# Patient Record
Sex: Male | Born: 1985 | Race: White | Hispanic: No | Marital: Married | Smoking: Current every day smoker
Health system: Southern US, Community
[De-identification: ages and names within clinical notes are randomized; demographics above are authoritative.]

## PROBLEM LIST (undated history)

## (undated) HISTORY — PX: TYMPANOSTOMY TUBE PLACEMENT: SHX32

---

## 2014-03-24 ENCOUNTER — Inpatient Hospital Stay: Payer: Self-pay | Admitting: Student

## 2014-03-24 LAB — CBC WITH DIFFERENTIAL/PLATELET
BASOS ABS: 0 10*3/uL (ref 0.0–0.1)
Basophil %: 0.5 %
Eosinophil #: 0.1 10*3/uL (ref 0.0–0.7)
Eosinophil %: 1.5 %
HCT: 42.8 % (ref 40.0–52.0)
HGB: 14.2 g/dL (ref 13.0–18.0)
Lymphocyte #: 1.4 10*3/uL (ref 1.0–3.6)
Lymphocyte %: 15.2 %
MCH: 30.9 pg (ref 26.0–34.0)
MCHC: 33 g/dL (ref 32.0–36.0)
MCV: 93 fL (ref 80–100)
Monocyte #: 0.8 x10 3/mm (ref 0.2–1.0)
Monocyte %: 8.3 %
NEUTROS ABS: 7 10*3/uL — AB (ref 1.4–6.5)
Neutrophil %: 74.5 %
Platelet: 210 10*3/uL (ref 150–440)
RBC: 4.59 10*6/uL (ref 4.40–5.90)
RDW: 12.3 % (ref 11.5–14.5)
WBC: 9.4 10*3/uL (ref 3.8–10.6)

## 2014-03-24 LAB — BASIC METABOLIC PANEL
Anion Gap: 6 — ABNORMAL LOW (ref 7–16)
BUN: 22 mg/dL — ABNORMAL HIGH (ref 7–18)
CALCIUM: 9.2 mg/dL (ref 8.5–10.1)
CREATININE: 1.09 mg/dL (ref 0.60–1.30)
Chloride: 104 mmol/L (ref 98–107)
Co2: 27 mmol/L (ref 21–32)
EGFR (African American): >60
EGFR (Non-African Amer.): >60
Glucose: 85 mg/dL (ref 65–99)
Osmolality: 276 (ref 275–301)
Potassium: 3.9 mmol/L (ref 3.5–5.1)
Sodium: 137 mmol/L (ref 136–145)

## 2014-03-25 LAB — BASIC METABOLIC PANEL
Anion Gap: 5 — ABNORMAL LOW (ref 7–16)
BUN: 16 mg/dL (ref 7–18)
CALCIUM: 9 mg/dL (ref 8.5–10.1)
CHLORIDE: 106 mmol/L (ref 98–107)
CREATININE: 1.12 mg/dL (ref 0.60–1.30)
Co2: 27 mmol/L (ref 21–32)
GLUCOSE: 77 mg/dL (ref 65–99)
Osmolality: 276 (ref 275–301)
Potassium: 3.9 mmol/L (ref 3.5–5.1)
SODIUM: 138 mmol/L (ref 136–145)

## 2014-03-25 LAB — CBC WITH DIFFERENTIAL/PLATELET
Basophil #: 0 10*3/uL (ref 0.0–0.1)
Basophil %: 0.4 %
EOS ABS: 0.3 10*3/uL (ref 0.0–0.7)
EOS PCT: 4.2 %
HCT: 42.1 % (ref 40.0–52.0)
HGB: 14 g/dL (ref 13.0–18.0)
LYMPHS ABS: 1.4 10*3/uL (ref 1.0–3.6)
LYMPHS PCT: 22.4 %
MCH: 31.1 pg (ref 26.0–34.0)
MCHC: 33.2 g/dL (ref 32.0–36.0)
MCV: 94 fL (ref 80–100)
MONOS PCT: 10.7 %
Monocyte #: 0.7 x10 3/mm (ref 0.2–1.0)
NEUTROS PCT: 62.3 %
Neutrophil #: 4 10*3/uL (ref 1.4–6.5)
Platelet: 176 10*3/uL (ref 150–440)
RBC: 4.49 10*6/uL (ref 4.40–5.90)
RDW: 12.5 % (ref 11.5–14.5)
WBC: 6.4 10*3/uL (ref 3.8–10.6)

## 2014-03-25 LAB — LIPID PANEL
Cholesterol: 140 mg/dL (ref 0–200)
HDL Cholesterol: 52 mg/dL (ref 40–60)
Ldl Cholesterol, Calc: 79 mg/dL (ref 0–100)
TRIGLYCERIDES: 46 mg/dL (ref 0–200)
VLDL Cholesterol, Calc: 9 mg/dL (ref 5–40)

## 2014-03-25 LAB — HEMOGLOBIN A1C: Hemoglobin A1C: 5.5 % (ref 4.2–6.3)

## 2014-11-07 ENCOUNTER — Ambulatory Visit: Payer: Self-pay | Admitting: Physician Assistant

## 2015-02-11 NOTE — H&P (Signed)
PATIENT NAME:  Arthur Burnett, DOOM MR#:  [id number] DATE OF BIRTH:  1985/11/20  DATE OF ADMISSION:  03/24/2014  PRIMARY CARE PHYSICIAN: Dr. Greggory Stallion at [facility].  REFERRING EMERGENCY ROOM PHYSICIAN: Dr. Dorothea Glassman  CHIEF COMPLAINT: Swelling and redness on the arm.  HISTORY OF PRESENTING ILLNESS: A 29 year old male with no past medical history and not following with doctor regularly, not taking any medication at home. Had some redness and swelling on his left forearm, which he noticed since the last 4 days, which is gradually getting worse, so finally he went to his medical office yesterday, and  prescribed oral Bactrim and told to come back within 24 hours. They also marked the area with marker on the skin for redness. Today when he went back, his redness was worse than yesterday, so the doctor advised him to come to the Emergency Room. On further questioning, he said there is some pain, but which is mild, 6 out of 10, constant, some heaviness type, but it is not restricting his movements. He is able to do all the activities, and denies any fever.   REVIEW OF SYSTEMS:   CONSTITUTIONAL: Negative for fever, fatigue, weakness. Positive for mild pain on left forearm. No weight loss.  EYES: No blurring, double vision, discharge or redness.  EARS, NOSE, THROAT: No tinnitus, ear pain, hearing loss.  RESPIRATORY: No cough, wheezing, hemoptysis, or shortness of breath.  CARDIOVASCULAR: No chest pain, orthopnea, edema, arrhythmia, palpitations.  GASTROINTESTINAL: No nausea, vomiting, diarrhea, abdominal pain.  GENITOURINARY: No dysuria, hematuria, increased frequency.  ENDOCRINE: No heat or cold intolerance. No excessive sweating.  SKIN: No acne, rashes, or lesions.  MUSCULOSKELETAL: No pain or swelling in the joints, but there is swelling of the left forearm.  NEUROLOGIC: No numbness, weakness, tremor or vertigo.  PSYCHIATRIC: No anxiety, insomnia, bipolar disorder.    PAST MEDICAL HISTORY: None, but not following with any doctor.   PAST SURGICAL HISTORY: None.   SOCIAL HISTORY: No smoking, no drinking alcohol, no illegal drug use. He works in a [employer]  FAMILY HISTORY: Positive for diabetes in his mother and grandfather. His sister was diagnosed with diabetes at age of 105. Father has hypertension.   HOME MEDICATIONS: Bactrim, which was started yesterday.   VITAL SIGNS: Temperature 98.2, pulse 71, respirations 18, blood pressure 139/90, pulse ox is 98% on room air.   PHYSICAL EXAMINATION: GENERAL: The patient is fully alert and oriented to time, place, and person. Does not appear in any acute distress.  HEENT: Head and neck atraumatic. Conjunctiva pink. Oral mucosa moist.  NECK: Supple. No JVD.  RESPIRATORY: Bilateral equal and clear air entry.  CARDIOVASCULAR: S1, S2 present. Regular. No murmur.  ABDOMEN: Soft, nontender. Bowel sounds present. No organomegaly.  SKIN: On left forearm extending from wrist to elbow, there is redness and slightly warm to touch, and tender. There is a furuncle in the center of that, appears to be the origin point of the cellulitis. LEGS: No edema.  NEUROLOGIC: Power 5/5. Follows commands. No tremor or rigidity. Sensation is preserved. Left arm movements are normal.  PSYCHIATRIC: Does not appear in any acute psychiatric illness at this time.   IMPORTANT LAB RESULTS: Glucose 85, BUN 22, creatinine 1.09, sodium 137, potassium 3.9, chloride 104, CO2 is 27, calcium 9.2. WBC 9.4, hemoglobin 14.2, platelet count is 210, MCV is 93.   ASSESSMENT AND PLAN: A 29 year old male who has cellulitis, was given Bactrim as outpatient, but had worsening in the redness  and swelling, so came to Emergency Room.   1.  Cellulitis. Failed outpatient treatment. Will give IV vancomycin and Zosyn over here. Will  check his HbA1c level, as he has strong family history of diabetes,   Total time spent on this admission is 40 minutes.     ____________________________ Hope PigeonVaibhavkumar G. Elisabeth PigeonVachhani, MD vgv:mr D: 03/24/2014 20:27:05 ET T: 03/24/2014 20:49:34 ET JOB#: [id number]  cc: Hope PigeonVaibhavkumar G. Elisabeth PigeonVachhani, MD, <Dictator> Altamese DillingVAIBHAVKUMAR Dawna Jakes MD ELECTRONICALLY SIGNED 04/06/2014 9:05

## 2015-02-11 NOTE — Discharge Summary (Signed)
PATIENT NAME:  Arthur Burnett, [name] MR#:  [id number] DATE OF BIRTH:  Aug 10, 1986  DATE OF ADMISSION:  03/24/2014 DATE OF DISCHARGE:  03/26/2014  CONSULTANTS: Cristal Deerhristopher A. Lundquist, MD, from surgery.   CHIEF COMPLAINT: Left arm swelling and redness.   DISCHARGE DIAGNOSES: 1. Cellulitis/superficial abscess of the left upper extremity.  2. 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   MEDICATIONS:  1. Bactrim double strength 1 tab 2 times a day for 9 days.  2. Ultram 50 mg every 6 to 8 hours as needed for pain for 2 days only 3. Dry dressing.   FOLLOWUP: Please follow with PCP within 1 to 2 weeks. Please follow with surgery next week. If redness or drainage worsens, you develop fevers or any increase in swelling of your infection site, call your doctor right away.   DISPOSITION: Home.   SIGNIFICANT LABS AND IMAGING: Initial white count of 9.4, BUN 22, creatinine 1.09, last white count of 6.4.   HISTORY OF PRESENT ILLNESS AND HOSPITAL COURSE: For full details of H and P, please see the dictation on June 4th by Dr. Elisabeth PigeonVachhani. This is a pleasant 29 year old who is usually in good health, who came in for the above chief complaint. He had noticed that it started the last four days, which gradually got worse and he went to see [facility] Gre[facility] at [facility] in [place] who prescribed him Bactrim. The patient took had 2 tablets of Bactrim and came into the hospital as he felt the area was getting worse. He was admitted to the hospitalist service and started on vancomycin and Zosyn. He did have evidence for cellulitis and the area did develop a little bit of drainage. He has good range of motion. He was seen by surgery today, who felt that the area was draining adequately at this time and cellulitis is more superficial in nature and does not need and I and D. The patient is afebrile, has no significant leukocytosis, and  the redness has improved. He will be discharged with instructions to finish the  Bactrim he already has at home, which is 9 tablets, and a prescription for a few doses of Ultram was given to him. He denies having any recent IV drug abuse. This is posterior forearm and he was instructed to keep dry dressings on it.   TOTAL TIME SPENT: About 35 minutes today.   CODE STATUS: The patient is a full code.    ____________________________ Krystal EatonShayiq Adaiah Morken, MD sa:lm D: 03/26/2014 12:27:06 ET T: 03/26/2014 22:00:33 ET JOB#: [id number]  cc: Krystal EatonShayiq Komal Stangelo, MD, <Dictator> Angus PalmsSionne George, MD Si Raiderhristopher A. Juliann PulseLundquist, MD Krystal EatonSHAYIQ Lamona Eimer MD ELECTRONICALLY SIGNED 04/19/2014 12:42

## 2015-02-11 NOTE — Consult Note (Signed)
PATIENT NAME:  Arthur Burnett, Arthur Burnett MR#:  [id number] DATE OF BIRTH:  Sep 21, 1986  DATE OF CONSULTATION:  03/26/2014  CONSULTING PHYSICIAN:  Cristal Deerhristopher A. Deniece Rankin, MD  REASON FOR CONSULT: Swelling, redness, and purulence from left posterior arm.  HISTORY OF PRESENT ILLNESS:  Arthur Burnett is a pleasant, 29 year old male who has no significant past medical history except for recurrent small abscesses throughout his body, who presented with 4 days of worsening redness and swelling of the left forearm. Arthur Burnett went to his medical office, who prescribed him Bactrim. Arthur Burnett returned to the doctor who prescribed him the Bactrim, and it was noted to be more red and erythematous. At that time Arthur Burnett was brought to the Emergency Room. It was painful at that time. No change in range of motion. No fevers, chills, night sweats, shortness of breath, chest pain, abdominal pain, nausea, vomiting, diarrhea, constipation, dysuria, hematuria.   PAST MEDICAL HISTORY: None.   PAST SURGICAL HISTORY: None.   HOME MEDICATIONS: Bactrim which was started one day prior to admission.  SOCIAL HISTORY: Denies alcohol or tobacco use. Does not have a history of IV drug use.   FAMILY HISTORY: Positive for diabetes and hypertension.   REVIEW OF SYSTEMS:  A 12-point review of systems was obtained. Pertinent positives and negatives as above.   PHYSICAL EXAMINATION:  VITAL SIGNS: Temperature 98.1, pulse 50, blood pressure 98/55, respirations 69, 100% on room air.  GENERAL: No acute distress. Alert and oriented x 3.  HEAD: Normocephalic, atraumatic.  EYES: No scleral icterus. No conjunctivitis.  FACE:  No obvious facial trauma. Normal external nose. Normal external ears.  CHEST: Lungs clear to auscultation. Moving air well.  HEART: Regular rate and rhythm. No murmurs, rubs or gallops.  ABDOMEN: Soft, nontender, nondistended.  EXTREMITIES: Arthur Burnett does have a small area of some superficial blistering with some purulence. It does not  extend into the deep subcutaneous tissues. No obvious induration. No obvious erythema. Strength 5 out of 5.  NEUROLOGIC: Cranial nerves II through XII grossly intact.   LABS:  His recent white cell count was 6.4, down from 9.4 on admission, hemoglobin, hematocrit, and platelets are all unremarkable and stable. Creatinine is 1.12.  IMAGING:  No imaging was performed.   ASSESSMENT AND PLAN: Arthur Burnett is a pleasant, 29 year old with a history of left arm abscess, which is draining spontaneously. No need for surgical intervention. Would continue Bactrim. May follow up with me as an outpatient to ensure resolution. 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     ____________________________ Si Raiderhristopher A. Wilburta Milbourn, MD cal:mr D: 03/26/2014 12:25:59 ET T: 03/26/2014 18:04:48 ET JOB#: [id number]  cc: Cristal Deerhristopher A. Henryk Ursin, MD, <Dictator> Jarvis NewcomerHRISTOPHER A Braelynne Garinger MD ELECTRONICALLY SIGNED 04/12/2014 10:47

## 2016-01-08 ENCOUNTER — Other Ambulatory Visit: Payer: Self-pay | Admitting: Nurse Practitioner

## 2016-01-08 DIAGNOSIS — K739 Chronic hepatitis, unspecified: Secondary | ICD-10-CM

## 2016-01-16 ENCOUNTER — Ambulatory Visit: Payer: Self-pay

## 2016-01-16 ENCOUNTER — Ambulatory Visit
Admission: RE | Admit: 2016-01-16 | Discharge: 2016-01-16 | Disposition: A | Discharge status: Home / Self Care | Payer: BLUE CROSS/BLUE SHIELD | Source: Ambulatory Visit | Attending: Nurse Practitioner | Admitting: Nurse Practitioner

## 2016-01-16 DIAGNOSIS — R932 Abnormal findings on diagnostic imaging of liver and biliary tract: Secondary | ICD-10-CM | POA: Diagnosis not present

## 2016-01-16 DIAGNOSIS — K739 Chronic hepatitis, unspecified: Secondary | ICD-10-CM

## 2017-06-15 IMAGING — US US ABDOMEN LIMITED
1 series · 12 of 12 positions shown · non-contrast
Comparison: None.

CLINICAL DATA: Chronic hepatitis-C.

EXAM:
US ABDOMEN LIMITED - RIGHT UPPER QUADRANT
ULTRASOUND HEPATIC ELASTOGRAPHY
TECHNIQUE: Limited right upper quadrant abdominal ultrasound was performed. In
addition, ultrasound elastography evaluation of the liver was
performed. A region of interest was placed in the right lobe of the
liver. Following application of a compressive sonographic pulse,
shear waves were detected in the adjacent hepatic tissue and the
shear wave velocity was calculated. Multiple assessments were
performed at the selected site. Median shear wave velocity is
correlated to a Metavir fibrosis score.

[Series 1: us abdomen limited · 0.20mm/px · 12 of 12 slices shown]
[im 1/12]
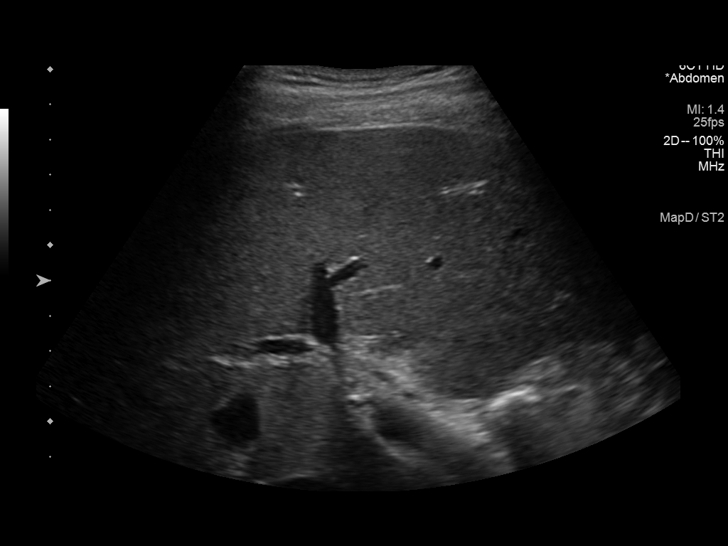
[im 2/12]
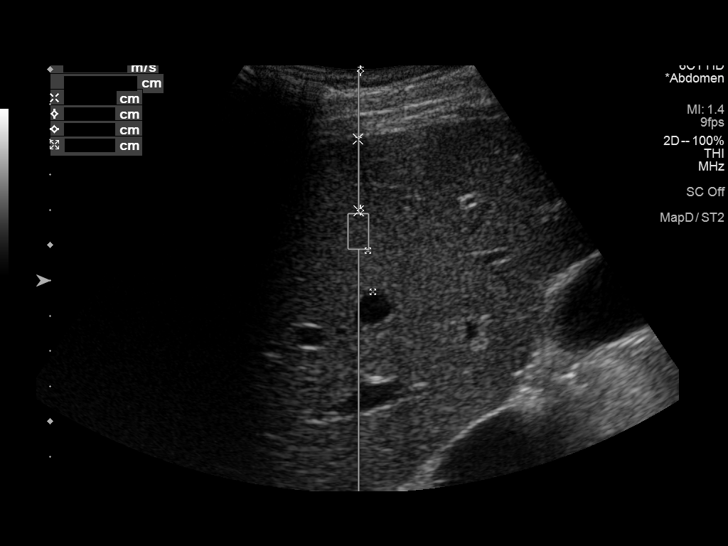
[im 3/12]
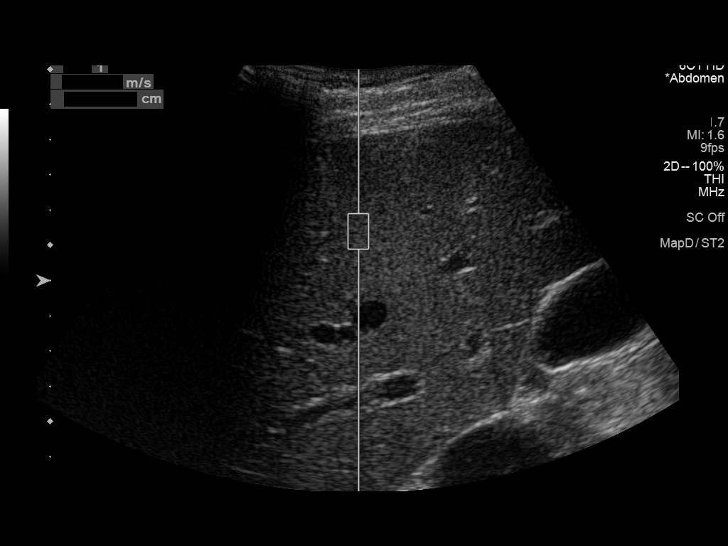
[im 4/12]
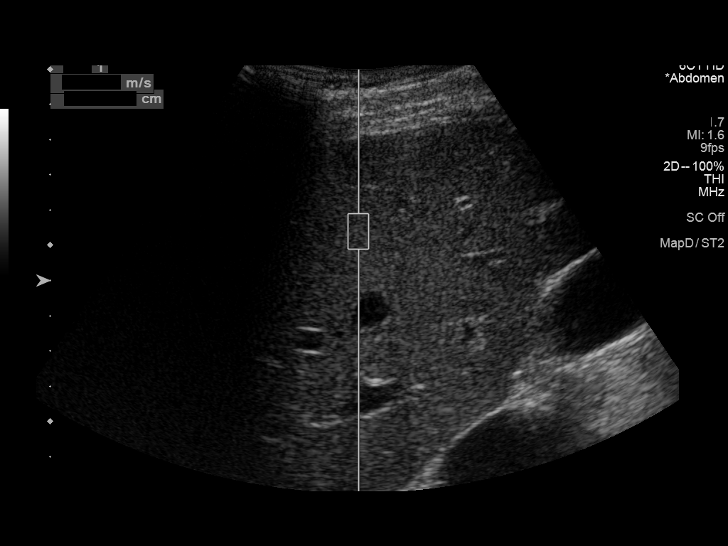
[im 5/12]
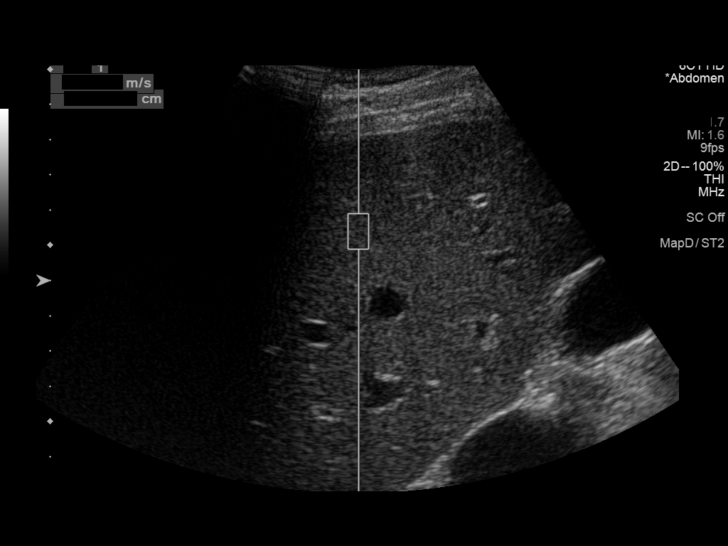
[im 6/12]
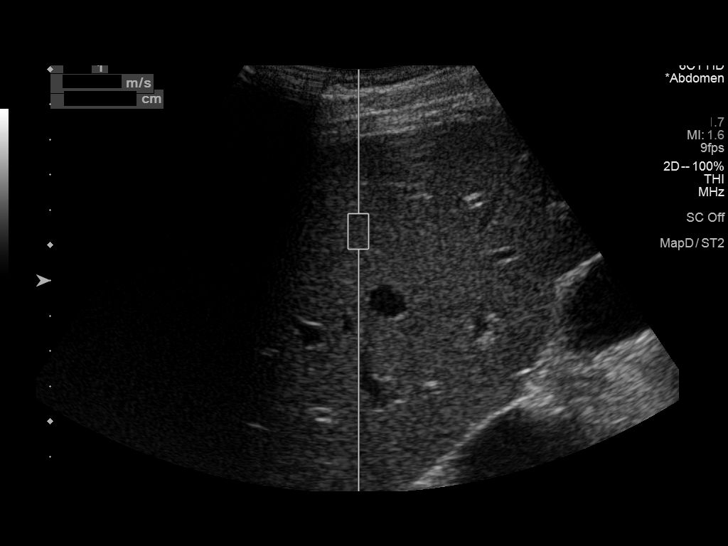
[im 7/12]
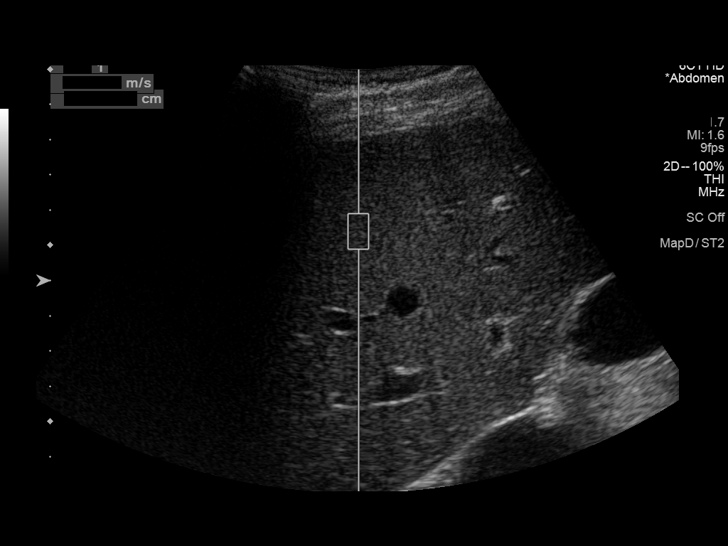
[im 8/12]
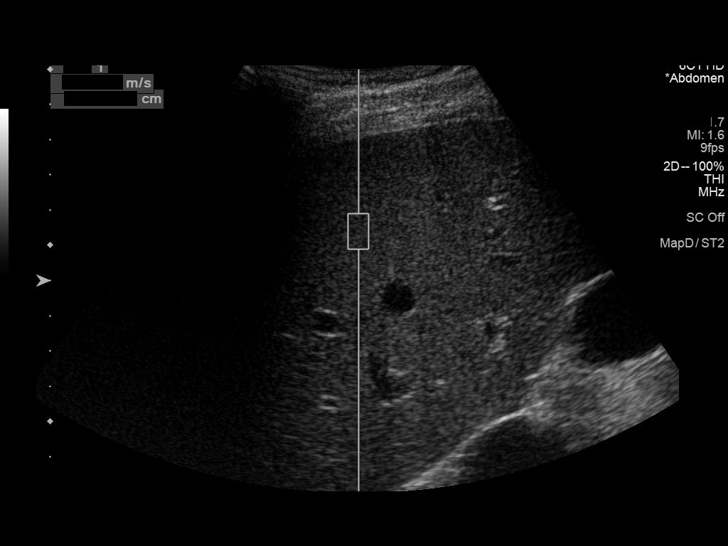
[im 9/12]
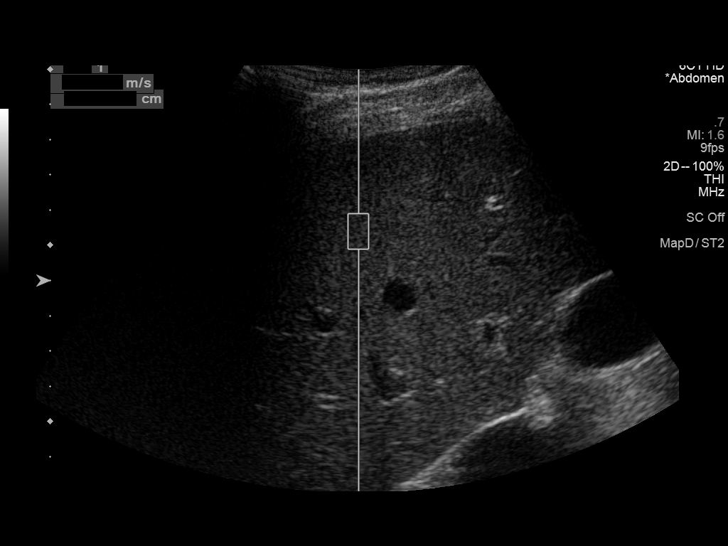
[im 10/12]
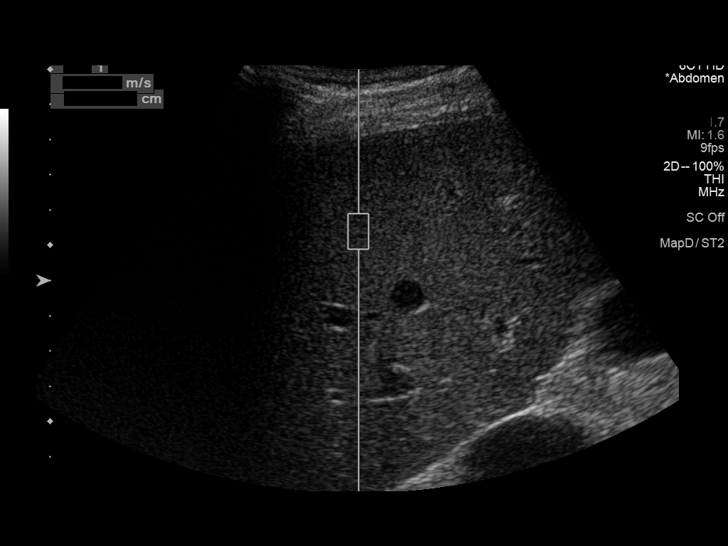
[im 11/12]
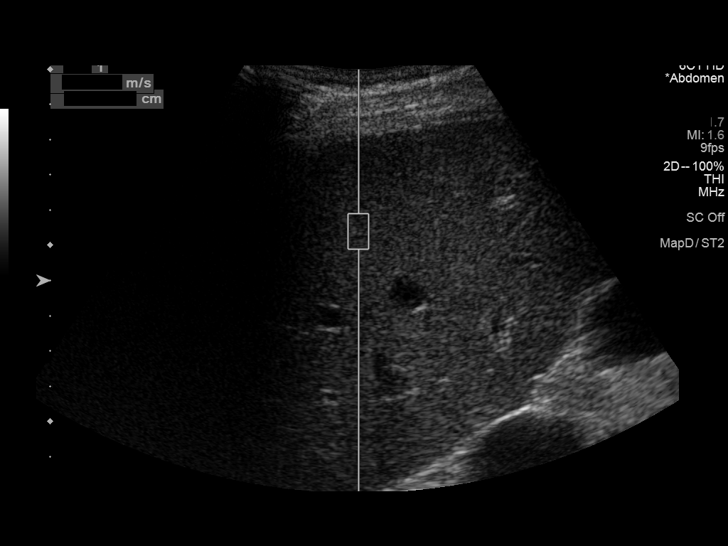
[im 12/12]
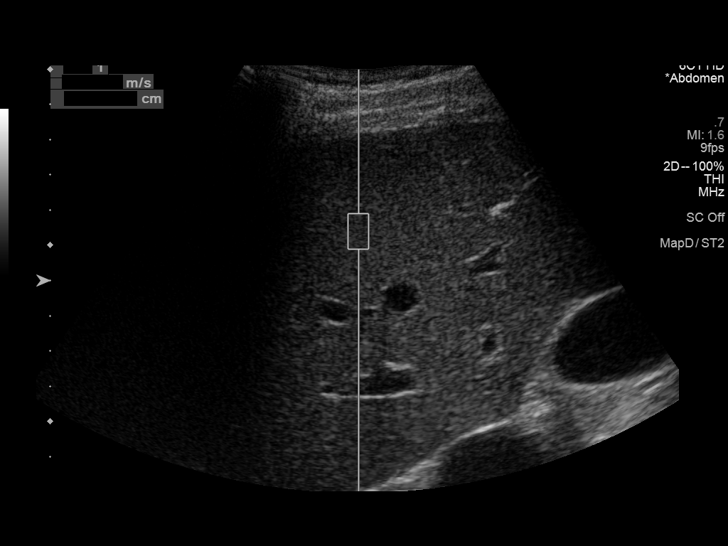

[12 of 12 positions shown; findings below may reference images not displayed]

FINDINGS: ULTRASOUND ABDOMEN LIMITED RIGHT UPPER QUADRANT

Gallbladder: No gallstones or wall thickening visualized. No
sonographic Murphy sign noted by sonographer.

Common bile duct: Diameter: 3 mm.

Liver: No focal lesion or significant contour irregularity
identified. Within normal limits in parenchymal echogenicity.

ULTRASOUND HEPATIC ELASTOGRAPHY

Device: Siemens Helix VTQ

Transducer 6C1

Patient position: Supine

Number of measurements:  10

Hepatic Segment:  8

Median velocity:   1.85  m/sec

IQR:

IQR/Median velocity ratio

Corresponding Metavir fibrosis score:  F2 + some F3

Risk of fibrosis: Moderate

Follow up:   Additional testing appropriate

Limitations of exam:  None.

Pertinent findings noted on other imaging exams:  None

Please note that abnormal shear wave velocities may also be
identified in clinical settings other than with hepatic fibrosis,
such as: acute hepatitis, elevated right heart and central venous
pressures including use of beta blockers, Makhosi disease
(Fraiji), infiltrative processes such as
mastocytosis/amyloidosis/infiltrative tumor, extrahepatic
cholestasis, in the post-prandial state, and liver transplantation.
Correlation with patient history, laboratory data, and clinical
condition recommended.
IMPRESSION: 1. Morphologically, the liver and gallbladder appear normal. No
focal hepatic abnormalities are identified.
2. Abnormal hepatic elastography study. Median hepatic shear wave
velocity is calculated at 1.85 m/sec.
Corresponding Metavir fibrosis score is F2 + some F3. Risk of
fibrosis is moderate.
3. Follow-up:  Additional testing appropriate.

## 2018-02-11 ENCOUNTER — Emergency Department: Payer: No Typology Code available for payment source

## 2018-02-11 ENCOUNTER — Other Ambulatory Visit: Payer: Self-pay

## 2018-02-11 ENCOUNTER — Inpatient Hospital Stay
Admission: EM | Admit: 2018-02-11 | Discharge: 2018-02-13 | DRG: 195 | Disposition: A | Discharge status: Home / Self Care | Payer: No Typology Code available for payment source | Attending: Specialist | Admitting: Specialist

## 2018-02-11 DIAGNOSIS — J189 Pneumonia, unspecified organism: Secondary | ICD-10-CM | POA: Diagnosis not present

## 2018-02-11 DIAGNOSIS — Z9622 Myringotomy tube(s) status: Secondary | ICD-10-CM | POA: Diagnosis present

## 2018-02-11 DIAGNOSIS — R091 Pleurisy: Secondary | ICD-10-CM | POA: Diagnosis present

## 2018-02-11 DIAGNOSIS — F17210 Nicotine dependence, cigarettes, uncomplicated: Secondary | ICD-10-CM | POA: Diagnosis present

## 2018-02-11 DIAGNOSIS — J181 Lobar pneumonia, unspecified organism: Secondary | ICD-10-CM | POA: Diagnosis not present

## 2018-02-11 DIAGNOSIS — A419 Sepsis, unspecified organism: Secondary | ICD-10-CM

## 2018-02-11 DIAGNOSIS — E860 Dehydration: Secondary | ICD-10-CM | POA: Diagnosis present

## 2018-02-11 LAB — URINALYSIS, ROUTINE W REFLEX MICROSCOPIC
BACTERIA UA: NONE SEEN
BILIRUBIN URINE: NEGATIVE
Glucose, UA: NEGATIVE mg/dL
Hgb urine dipstick: NEGATIVE
Ketones, ur: 20 mg/dL — AB
Leukocytes, UA: NEGATIVE
Nitrite: NEGATIVE
PH: 7 (ref 5.0–8.0)
Protein, ur: 30 mg/dL — AB
SPECIFIC GRAVITY, URINE: 1.027 (ref 1.005–1.030)

## 2018-02-11 LAB — BASIC METABOLIC PANEL
ANION GAP: 6 (ref 5–15)
BUN: 9 mg/dL (ref 6–20)
CO2: 24 mmol/L (ref 22–32)
CREATININE: 0.7 mg/dL (ref 0.61–1.24)
Calcium: 8.2 mg/dL — ABNORMAL LOW (ref 8.9–10.3)
Chloride: 109 mmol/L (ref 101–111)
GFR calc non Af Amer: >60 mL/min (ref >60)
Glucose, Bld: 119 mg/dL — ABNORMAL HIGH (ref 65–99)
POTASSIUM: 4 mmol/L (ref 3.5–5.1)
SODIUM: 139 mmol/L (ref 135–145)

## 2018-02-11 LAB — COMPREHENSIVE METABOLIC PANEL
ALBUMIN: 4.5 g/dL (ref 3.5–5.0)
ALK PHOS: 72 U/L (ref 38–126)
ALT: 13 U/L — ABNORMAL LOW (ref 17–63)
AST: 19 U/L (ref 15–41)
Anion gap: 8 (ref 5–15)
BUN: 10 mg/dL (ref 6–20)
CO2: 27 mmol/L (ref 22–32)
CREATININE: 0.78 mg/dL (ref 0.61–1.24)
Calcium: 9.5 mg/dL (ref 8.9–10.3)
Chloride: 102 mmol/L (ref 101–111)
GFR calc Af Amer: >60 mL/min (ref >60)
GLUCOSE: 107 mg/dL — AB (ref 65–99)
POTASSIUM: 3.6 mmol/L (ref 3.5–5.1)
Sodium: 137 mmol/L (ref 135–145)
TOTAL PROTEIN: 8.1 g/dL (ref 6.5–8.1)
Total Bilirubin: 0.8 mg/dL (ref 0.3–1.2)

## 2018-02-11 LAB — CBC WITH DIFFERENTIAL/PLATELET
BASOS ABS: 0 10*3/uL (ref 0–0.1)
Basophils Relative: 0 %
Eosinophils Absolute: 0.1 10*3/uL (ref 0–0.7)
Eosinophils Relative: 1 %
HCT: 44.6 % (ref 40.0–52.0)
HEMOGLOBIN: 15.2 g/dL (ref 13.0–18.0)
LYMPHS PCT: 6 %
Lymphs Abs: 0.7 10*3/uL — ABNORMAL LOW (ref 1.0–3.6)
MCH: 31.6 pg (ref 26.0–34.0)
MCHC: 34.2 g/dL (ref 32.0–36.0)
MCV: 92.3 fL (ref 80.0–100.0)
MONO ABS: 0.3 10*3/uL (ref 0.2–1.0)
MONOS PCT: 3 %
NEUTROS ABS: 10.3 10*3/uL — AB (ref 1.4–6.5)
NEUTROS PCT: 90 %
Platelets: 247 10*3/uL (ref 150–440)
RBC: 4.83 MIL/uL (ref 4.40–5.90)
RDW: 12.7 % (ref 11.5–14.5)
WBC: 11.4 10*3/uL — ABNORMAL HIGH (ref 3.8–10.6)

## 2018-02-11 LAB — CBC
HCT: 36.1 % — ABNORMAL LOW (ref 40.0–52.0)
Hemoglobin: 12.9 g/dL — ABNORMAL LOW (ref 13.0–18.0)
MCH: 32.9 pg (ref 26.0–34.0)
MCHC: 35.6 g/dL (ref 32.0–36.0)
MCV: 92.3 fL (ref 80.0–100.0)
Platelets: 197 10*3/uL (ref 150–440)
RBC: 3.92 MIL/uL — AB (ref 4.40–5.90)
RDW: 12.7 % (ref 11.5–14.5)
WBC: 14.7 10*3/uL — AB (ref 3.8–10.6)

## 2018-02-11 LAB — LACTIC ACID, PLASMA: Lactic Acid, Venous: 1.2 mmol/L (ref 0.5–1.9)

## 2018-02-11 LAB — TROPONIN I
Troponin I: <0.03 ng/mL (ref <0.03)
Troponin I: <0.03 ng/mL (ref <0.03)

## 2018-02-11 LAB — MRSA PCR SCREENING: MRSA by PCR: NEGATIVE

## 2018-02-11 LAB — FIBRIN DERIVATIVES D-DIMER (ARMC ONLY): FIBRIN DERIVATIVES D-DIMER (ARMC): 301.17 ng{FEU}/mL (ref 0.00–499.00)

## 2018-02-11 MED ORDER — SODIUM CHLORIDE 0.9 % IV BOLUS
1000.0000 mL | Freq: Once | INTRAVENOUS | Status: AC
  Administered 2018-02-11: 1000 mL via INTRAVENOUS

## 2018-02-11 MED ORDER — ONDANSETRON HCL 4 MG/2ML IJ SOLN
4.0000 mg | Freq: Once | INTRAMUSCULAR | Status: AC
  Administered 2018-02-11: 4 mg via INTRAVENOUS
  Filled 2018-02-11: qty 2

## 2018-02-11 MED ORDER — MORPHINE SULFATE (PF) 2 MG/ML IV SOLN
2.0000 mg | INTRAVENOUS | Status: DC | PRN
  Administered 2018-02-11 (×2): 2 mg via INTRAVENOUS
  Filled 2018-02-11 (×2): qty 1

## 2018-02-11 MED ORDER — KETOROLAC TROMETHAMINE 30 MG/ML IJ SOLN
15.0000 mg | Freq: Once | INTRAMUSCULAR | Status: AC
  Administered 2018-02-11: 15 mg via INTRAVENOUS

## 2018-02-11 MED ORDER — HYDROMORPHONE HCL 1 MG/ML IJ SOLN
1.0000 mg | INTRAMUSCULAR | Status: DC | PRN
Start: 2018-02-11 — End: 2018-02-12
  Administered 2018-02-11 – 2018-02-12 (×2): 1 mg via INTRAVENOUS
  Filled 2018-02-11 (×3): qty 1

## 2018-02-11 MED ORDER — ONDANSETRON HCL 4 MG/2ML IJ SOLN: 4.0000 mg | Freq: Four times a day (QID) | INTRAMUSCULAR | Status: DC | PRN

## 2018-02-11 MED ORDER — ACETAMINOPHEN 650 MG RE SUPP: 650.0000 mg | Freq: Four times a day (QID) | RECTAL | Status: DC | PRN

## 2018-02-11 MED ORDER — SODIUM CHLORIDE 0.9 % IV SOLN
1.0000 g | Freq: Once | INTRAVENOUS | Status: AC
  Administered 2018-02-11: 1 g via INTRAVENOUS
  Filled 2018-02-11: qty 10

## 2018-02-11 MED ORDER — SODIUM CHLORIDE 0.9 % IV SOLN
1.0000 g | INTRAVENOUS | Status: DC
  Administered 2018-02-12 – 2018-02-13 (×2): 1 g via INTRAVENOUS
  Filled 2018-02-11 (×2): qty 10

## 2018-02-11 MED ORDER — ACETAMINOPHEN 325 MG PO TABS
650.0000 mg | ORAL_TABLET | Freq: Four times a day (QID) | ORAL | Status: DC | PRN
Start: 2018-02-11 — End: 2018-02-13
  Administered 2018-02-11 – 2018-02-12 (×2): 650 mg via ORAL
  Filled 2018-02-11 (×2): qty 2

## 2018-02-11 MED ORDER — ENOXAPARIN SODIUM 40 MG/0.4ML ~~LOC~~ SOLN: 40.0000 mg | SUBCUTANEOUS | Status: DC

## 2018-02-11 MED ORDER — KETOROLAC TROMETHAMINE 30 MG/ML IJ SOLN
INTRAMUSCULAR | Status: AC
  Administered 2018-02-11: 15 mg via INTRAVENOUS
  Filled 2018-02-11: qty 1

## 2018-02-11 MED ORDER — AZITHROMYCIN 500 MG IV SOLR
500.0000 mg | Freq: Once | INTRAVENOUS | Status: AC
  Administered 2018-02-11: 500 mg via INTRAVENOUS
  Filled 2018-02-11: qty 500

## 2018-02-11 MED ORDER — SENNOSIDES-DOCUSATE SODIUM 8.6-50 MG PO TABS: 1.0000 | ORAL_TABLET | Freq: Every evening | ORAL | Status: DC | PRN

## 2018-02-11 MED ORDER — ONDANSETRON HCL 4 MG PO TABS: 4.0000 mg | ORAL_TABLET | Freq: Four times a day (QID) | ORAL | Status: DC | PRN

## 2018-02-11 MED ORDER — SODIUM CHLORIDE 0.9 % IV SOLN
500.0000 mg | INTRAVENOUS | Status: DC
  Administered 2018-02-12 – 2018-02-13 (×2): 500 mg via INTRAVENOUS
  Filled 2018-02-11 (×2): qty 500

## 2018-02-11 MED ORDER — MORPHINE SULFATE (PF) 4 MG/ML IV SOLN
4.0000 mg | Freq: Once | INTRAVENOUS | Status: AC
  Administered 2018-02-11: 4 mg via INTRAVENOUS
  Filled 2018-02-11: qty 1

## 2018-02-11 MED ORDER — SODIUM CHLORIDE 0.9 % IV SOLN
INTRAVENOUS | Status: DC
  Administered 2018-02-11 – 2018-02-12 (×3): via INTRAVENOUS

## 2018-02-11 NOTE — Progress Notes (Signed)
Dr. Caryn BeeMaier notified of patient's unrelieved left lung/chest pain; Acknowledged; new order written. Windy Carinaurner,Tommye Lehenbauer K, RN 9:38 PM 02/11/2018

## 2018-02-11 NOTE — ED Triage Notes (Signed)
Pt c/o chest pain on the left side that shoots up into side of neck - pt also c/o shortness of breath - both started 2 hours ago - denies N/V - denies back pain - pt color is pale

## 2018-02-11 NOTE — Progress Notes (Signed)
Advanced care plan.  Purpose of the Encounter: CODE STATUS  Parties in Attendance: Patient  Patient's Decision Capacity:Good  Subjective/Patient's story: Presented for cough, elevated heart rate   Objective/Medical story Has pneumonia   Goals of care determination:  Advance directive discussed Patient wants every thing done for now, cardiac resuscitation, intubation and ventilator if need arises   CODE STATUS: Full code   Time spent discussing advanced care planning: 16 minutes

## 2018-02-11 NOTE — ED Notes (Signed)
Pt up to toilet 

## 2018-02-11 NOTE — H&P (Signed)
[facility] [place] at [facility]   PATIENT NAME: Arthur GalasChristopher Burnett    MR#:  [id number]  DATE OF BIRTH:  Apr 22, 1986  DATE OF ADMISSION:  02/11/2018  PRIMARY CARE PHYSICIAN: Patient, No Pcp Per   REQUESTING/REFERRING PHYSICIAN:   CHIEF COMPLAINT:   Chief Complaint  Patient presents with  . Chest Pain  . Shortness of Breath    HISTORY OF PRESENT ILLNESS: Arthur GalasChristopher Saffran  is a 32 y.o. male with no significant past medical history presented to the emergency room with cough, chest discomfort on deep breathing.  He also felt very warm and flushed.  The chest pending this on deep breathing.  The cough is dry in nature.  He was evaluated in the emergency room was found to be tachycardic and chest x-ray revealed pneumonia.  Patient was started on IV antibiotics.  Troponin was negative and the work-up in the emergency room.  EKG showed sinus tachycardia.  Patient received IV antibiotics in the emergency room and hospitalist service was consulted.  PAST MEDICAL HISTORY:  No history of hypertension, diabetes, hypertension  PAST SURGICAL HISTORY:  Past Surgical History:  Procedure Laterality Date  . TYMPANOSTOMY TUBE PLACEMENT      SOCIAL HISTORY:  Social History   Tobacco Use  . Smoking status: Current Every Day Smoker    Packs/day: 1.00    Types: Cigarettes  . Smokeless tobacco: Never Used  Substance Use Topics  . Alcohol use: Never    Frequency: Never    FAMILY HISTORY:  Family History  Problem Relation Age of Onset  . Diabetes Mellitus II Mother   . Hypertension Father     DRUG ALLERGIES: No Known Allergies  REVIEW OF SYSTEMS:   CONSTITUTIONAL: No fever, fatigue or weakness.  EYES: No blurred or double vision.  EARS, NOSE, AND THROAT: No tinnitus or ear pain.  RESPIRATORY: No cough, shortness of breath, wheezing or hemoptysis.  CARDIOVASCULAR: Has chest pain on deep breathing,  No orthopnea, edema.  GASTROINTESTINAL: No nausea,  vomiting, diarrhea or abdominal pain.  GENITOURINARY: No dysuria, hematuria.  ENDOCRINE: No polyuria, nocturia,  HEMATOLOGY: No anemia, easy bruising or bleeding SKIN: No rash or lesion. MUSCULOSKELETAL: No joint pain or arthritis.   NEUROLOGIC: No tingling, numbness, weakness.  PSYCHIATRY: No anxiety or depression.   MEDICATIONS AT HOME:  Prior to Admission medications   Not on File      PHYSICAL EXAMINATION:   VITAL SIGNS: Blood pressure 123/73, pulse (!) 117, temperature 98 F (36.7 C), temperature source Oral, resp. rate 17, height 5\' 7"  (1.702 m), weight 63.5 kg (140 lb), SpO2 97 %.  GENERAL:  32 y.o.-year-old patient lying in the bed with no acute distress.  EYES: Pupils equal, round, reactive to light and accommodation. No scleral icterus. Extraocular muscles intact.  HEENT: Head atraumatic, normocephalic. Oropharynx and nasopharynx clear.  NECK:  Supple, no jugular venous distention. No thyroid enlargement, no tenderness.  LUNGS: Decreased breath sounds bilaterally, Rales heard in the left lung. No use of accessory muscles of respiration.  CARDIOVASCULAR: S1, S2 tachycardia noted. No murmurs, rubs, or gallops.  ABDOMEN: Soft, nontender, nondistended. Bowel sounds present. No organomegaly or mass.  EXTREMITIES: No pedal edema, cyanosis, or clubbing.  NEUROLOGIC: Cranial nerves II through XII are intact. Muscle strength 5/5 in all extremities. Sensation intact. Gait not checked.  PSYCHIATRIC: The patient is alert and oriented x 3.  SKIN: No obvious rash, lesion, or ulcer.   LABORATORY PANEL:   CBC Recent Labs  Lab 02/11/18 1331  WBC 11.4*  HGB 15.2  HCT 44.6  PLT 247  MCV 92.3  MCH 31.6  MCHC 34.2  RDW 12.7  LYMPHSABS 0.7*  MONOABS 0.3  EOSABS 0.1  BASOSABS 0.0   ------------------------------------------------------------------------------------------------------------------  Chemistries  Recent Labs  Lab 02/11/18 1331  NA 137  K 3.6  CL 102  CO2 27   GLUCOSE 107*  BUN 10  CREATININE 0.78  CALCIUM 9.5  AST 19  ALT 13*  ALKPHOS 72  BILITOT 0.8   ------------------------------------------------------------------------------------------------------------------ estimated creatinine clearance is 119.1 mL/min (by C-G formula based on SCr of 0.78 mg/dL). ------------------------------------------------------------------------------------------------------------------ No results for input(s): TSH, T4TOTAL, T3FREE, THYROIDAB in the last 72 hours.  Invalid input(s): FREET3   Coagulation profile No results for input(s): INR, PROTIME in the last 168 hours. ------------------------------------------------------------------------------------------------------------------- No results for input(s): DDIMER in the last 72 hours. -------------------------------------------------------------------------------------------------------------------  Cardiac Enzymes Recent Labs  Lab 02/11/18 1331  TROPONINI <0.03   ------------------------------------------------------------------------------------------------------------------ Invalid input(s): POCBNP  ---------------------------------------------------------------------------------------------------------------  Urinalysis No results found for: COLORURINE, APPEARANCEUR, LABSPEC, PHURINE, GLUCOSEU, HGBUR, BILIRUBINUR, KETONESUR, PROTEINUR, UROBILINOGEN, NITRITE, LEUKOCYTESUR   RADIOLOGY: Dg Chest 2 View  Result Date: 02/11/2018 CLINICAL DATA:  Chest pain, shortness of breath. EXAM: CHEST - 2 VIEW COMPARISON:  None. FINDINGS: The heart size and mediastinal contours are within normal limits. No pneumothorax or pleural effusion is noted. Right lung is clear. Airspace opacity is noted in lingular segment of left upper lobe concerning for pneumonia. The visualized skeletal structures are unremarkable. IMPRESSION: Lingular opacity is noted most consistent with pneumonia. Followup PA and lateral chest  X-ray is recommended in 3-4 weeks following trial of antibiotic therapy to ensure resolution and exclude underlying malignancy. Electronically Signed   By: Lupita Raider, M.D.   On: 02/11/2018 14:07    EKG: Orders placed or performed during the hospital encounter of 02/11/18  . ED EKG within 10 minutes  . ED EKG within 10 minutes  . ED EKG 12-Lead  . ED EKG 12-Lead    IMPRESSION AND PLAN:  32 year old male patient with no past medical history presented with cough, weakness, difficulty breathing.  Community-acquired pneumonia Admit patient to medical floor Start patient on IV Rocephin and Zithromax antibiotics  Pleuritic chest pain PRN IV morphine for pain in the chest Cycle troponin to rule out ischemia  Tachycardia IV fluid hydration  Dehydration IV fluid hydration   tobacco abuse Smoking cessation counseled to the patient for 6 minutes Nicotine patch offered  DVT prophylaxis with subcu Lovenox    All the records are reviewed and case discussed with ED provider. Management plans discussed with the patient, family and they are in agreement.  CODE STATUS: Full code    TOTAL TIME TAKING CARE OF THIS PATIENT: 50 minutes.    Ihor Austin M.D on 02/11/2018 at 3:45 PM  Between 7am to 6pm - Pager - [phone]  After 6pm go to [email or web address] - [email or web address]  [facility]  Office  [phone]  CC: Primary care physician; Patient, No Pcp Per

## 2018-02-11 NOTE — ED Provider Notes (Addendum)
[facility] Emergency Department Provider Note   ____________________________________________   First MD Initiated Contact with Patient 02/11/18 1346     (approximate)  I have reviewed the triage vital signs and the nursing notes.   HISTORY  Chief Complaint Chest Pain and Shortness of Breath    HPI Arthur Burnett is a 32 y.o. male patient complains of left-sided chest pain rating up into the neck.  Came on suddenly at about 11:00 today.  He felt warm today per his wife but he did not feel hot.  He is flushed here.  Pain is worse with deep breathing sometimes with movement but otherwise is made worse.  Pain is severe.  Patient has not ever had anything like this before.  Pain is associated with shortness of breath both came on at the same time.  History reviewed. No pertinent past medical history.  There are no active problems to display for this patient.   History reviewed. No pertinent surgical history.  Prior to Admission medications   Not on File    Allergies Patient has no known allergies.  No family history on file.  Social History Social History   Tobacco Use  . Smoking status: Current Every Day Smoker    Packs/day: 1.00    Types: Cigarettes  . Smokeless tobacco: Never Used  Substance Use Topics  . Alcohol use: Never    Frequency: Never  . Drug use: Yes    Types: Marijuana    Comment: 2 times a week    Review of Systems  Constitutional: No fever/chills Eyes: No visual changes. ENT: No sore throat. Cardiovascular: chest pain. Respiratory:  shortness of breath. Gastrointestinal: No abdominal pain.  No nausea, no vomiting.  No diarrhea.  No constipation. Genitourinary: Negative for dysuria. Musculoskeletal: Negative for back pain. Skin: Negative for rash. Neurological: Negative for headaches, focal weakness   ____________________________________________   PHYSICAL EXAM:  VITAL SIGNS: ED Triage Vitals  [02/11/18 1328]  Enc Vitals Group     BP 126/81     Pulse Rate (!) 112     Resp (!) 24     Temp 98 F (36.7 C)     Temp Source Oral     SpO2 98 %     Weight 140 lb (63.5 kg)     Height 5\' 7"  (1.702 m)     Head Circumference      Peak Flow      Pain Score 10     Pain Loc      Pain Edu?      Excl. in GC?     Constitutional: Alert and oriented.  Looks ill Eyes: Conjunctivae are normal.   Head: Atraumatic. Nose: No congestion/rhinnorhea. Mouth/Throat: Mucous membranes are moist.  Oropharynx non-erythematous. Neck: No stridor. Cardiovascular: Rapid rate, regular rhythm. Grossly normal heart sounds.  Good peripheral circulation. Respiratory: Creased respiratory effort.  No retractions. Lungs CTAB. Gastrointestinal: Soft and nontender. No distention. No abdominal bruits. No CVA tenderness. Musculoskeletal: No lower extremity tenderness nor edema.  No joint effusions. Neurologic:  Normal speech and language. No gross focal neurologic deficits are appreciated. No gait instability. Skin:  Skin is warm, dry and intact. No rash noted the patient is flushed. Psychiatric: Mood and affect are normal. Speech and behavior are normal.  ____________________________________________   LABS (all labs ordered are listed, but only abnormal results are displayed)  Labs Reviewed  CBC WITH DIFFERENTIAL/PLATELET - Abnormal; Notable for the following components:  Result Value   WBC 11.4 (*)    Neutro Abs 10.3 (*)    Lymphs Abs 0.7 (*)    All other components within normal limits  COMPREHENSIVE METABOLIC PANEL - Abnormal; Notable for the following components:   Glucose, Bld 107 (*)    ALT 13 (*)    All other components within normal limits  CULTURE, BLOOD (ROUTINE X 2)  CULTURE, BLOOD (ROUTINE X 2)  TROPONIN I  FIBRIN DERIVATIVES D-DIMER ([facility] ONLY)  LACTIC ACID, PLASMA  LACTIC ACID, PLASMA  URINALYSIS, ROUTINE W REFLEX MICROSCOPIC    ____________________________________________  EKG  EKG read and interpreted by me shows sinus tachycardia rate of 121 she has ST flattening and slight depression really diffusely.  Probably rate related ____________________________________________  RADIOLOGY  ED MD interpretation: Patient has had a pneumonia in the lingula.  I reviewed the film  Official radiology report(s): Dg Chest 2 View  Result Date: 02/11/2018 CLINICAL DATA:  Chest pain, shortness of breath. EXAM: CHEST - 2 VIEW COMPARISON:  None. FINDINGS: The heart size and mediastinal contours are within normal limits. No pneumothorax or pleural effusion is noted. Right lung is clear. Airspace opacity is noted in lingular segment of left upper lobe concerning for pneumonia. The visualized skeletal structures are unremarkable. IMPRESSION: Lingular opacity is noted most consistent with pneumonia. Followup PA and lateral chest X-ray is recommended in 3-4 weeks following trial of antibiotic therapy to ensure resolution and exclude underlying malignancy. Electronically Signed   By: Lupita Raider, M.D.   On: 02/11/2018 14:07    ____________________________________________   PROCEDURES  Procedure(s) performed:   Procedures  Critical Care performed:   ____________________________________________   INITIAL IMPRESSION / ASSESSMENT AND PLAN / ED COURSE Patient is tachypneic and tachycardic he meets sepsis criteria.  He has pneumonia and a white count.  I will put him in the hospital and give him some fluids and antibiotics to start with.   She is asking for pain meds for the pleuritic chest pain which he says is severe I believe him.  I will give him some morphine and Zofran to start with.      ____________________________________________   FINAL CLINICAL IMPRESSION(S) / ED DIAGNOSES  Final diagnoses:  Community acquired pneumonia, unspecified laterality  Sepsis, due to unspecified organism [facility])     ED  Discharge Orders    None       Note:  This document was prepared using Dragon voice recognition software and may include unintentional dictation errors.    Arnaldo Natal, MD 02/11/18 1520    Arnaldo Natal, MD 02/11/18 1520

## 2018-02-11 NOTE — Progress Notes (Signed)
Pharmacy Antibiotic Note  Bennett ScrapeChristopher Jeffrey Favia is a 32 y.o. male admitted on 02/11/2018 with pneumonia.  Pharmacy has been consulted for ceftriaxone dosing.  Plan: ceftriaxone 1gm iv q24h   Height: 5\' 7"  (170.2 cm) Weight: 140 lb (63.5 kg) IBW/kg (Calculated) : 66.1  Temp (24hrs), Avg:98 F (36.7 C), Min:98 F (36.7 C), Max:98 F (36.7 C)  Recent Labs  Lab 02/11/18 1331  WBC 11.4*  CREATININE 0.78    Estimated Creatinine Clearance: 119.1 mL/min (by C-G formula based on SCr of 0.78 mg/dL).    No Known Allergies  Antimicrobials this admission: Anti-infectives (From admission, onward)   Start     Dose/Rate Route Frequency Ordered Stop   02/12/18 1500  cefTRIAXone (ROCEPHIN) 1 g in sodium chloride 0.9 % 100 mL IVPB     1 g 200 mL/hr over 30 Minutes Intravenous Every 24 hours 02/11/18 1531     02/11/18 1530  azithromycin (ZITHROMAX) 500 mg in sodium chloride 0.9 % 250 mL IVPB     500 mg 250 mL/hr over 60 Minutes Intravenous Every 24 hours 02/11/18 1528     02/11/18 1500  cefTRIAXone (ROCEPHIN) 1 g in sodium chloride 0.9 % 100 mL IVPB     1 g 200 mL/hr over 30 Minutes Intravenous  Once 02/11/18 1448     02/11/18 1500  azithromycin (ZITHROMAX) 500 mg in sodium chloride 0.9 % 250 mL IVPB     500 mg 250 mL/hr over 60 Minutes Intravenous  Once 02/11/18 1448        Microbiology results: No results found for this or any previous visit (from the past 240 hour(s)).   Thank you for allowing pharmacy to be a part of this patient's care.  Gerre PebblesGarrett Mialee Weyman 02/11/2018 3:32 PM

## 2018-02-11 NOTE — Progress Notes (Signed)
CODE SEPSIS - PHARMACY COMMUNICATION  **Broad Spectrum Antibiotics should be administered within 1 hour of Sepsis diagnosis**  Time Code Sepsis Called/Page Received: 1450  Antibiotics Ordered: 1500  Time of 1st antibiotic administration: 1520  Additional action taken by pharmacy: na  If necessary, Name of Provider/Nurse Contacted: na    Luan PullingGarrett Kavin Weckwerth ,PharmD Clinical Pharmacist  02/11/2018  4:39 PM

## 2018-02-11 NOTE — Progress Notes (Signed)
CODE SEPSIS - PHARMACY COMMUNICATION  **Broad Spectrum Antibiotics should be administered within 1 hour of Sepsis diagnosis**  Time Code Sepsis Called/Page Received: 1451   Antibiotics Ordered: Azithromycin/Ceftriaxone   Time of 1st antibiotic administration: ceftriaxone 1520   Additional action taken by pharmacy: none  If necessary, Name of Provider/Nurse Contacted: N/A    Sruti Ayllon L ,PharmD Clinical Pharmacist  02/11/2018  2:56 PM

## 2018-02-11 NOTE — ED Notes (Signed)
Patient transported to XR. 

## 2018-02-12 LAB — HIV ANTIBODY (ROUTINE TESTING W REFLEX): HIV SCREEN 4TH GENERATION: NONREACTIVE

## 2018-02-12 LAB — TROPONIN I
Troponin I: 0.04 ng/mL (ref <0.03)
Troponin I: <0.03 ng/mL (ref <0.03)

## 2018-02-12 MED ORDER — HYDROMORPHONE HCL 1 MG/ML IJ SOLN
1.0000 mg | INTRAMUSCULAR | Status: DC | PRN
  Administered 2018-02-12 – 2018-02-13 (×5): 1 mg via INTRAVENOUS
  Filled 2018-02-12 (×4): qty 1

## 2018-02-12 MED ORDER — ENSURE ENLIVE PO LIQD
237.0000 mL | Freq: Two times a day (BID) | ORAL | Status: DC
  Administered 2018-02-12 – 2018-02-13 (×3): 237 mL via ORAL

## 2018-02-12 MED ORDER — KETOROLAC TROMETHAMINE 30 MG/ML IJ SOLN
30.0000 mg | Freq: Four times a day (QID) | INTRAMUSCULAR | Status: DC | PRN
  Administered 2018-02-12 – 2018-02-13 (×3): 30 mg via INTRAVENOUS
  Filled 2018-02-12 (×4): qty 1

## 2018-02-12 NOTE — Progress Notes (Signed)
[facility] [place] [facility]   PATIENT NAME: Arthur Burnett    MR#:  [id number]  DATE OF BIRTH:  1985-11-22  SUBJECTIVE:   Patient here due to pleuritic chest pain and shortness of breath and noted to have pneumonia.  Patient continues to have significant pleurisy.  Positive cough but nonproductive, afebrile this morning.  REVIEW OF SYSTEMS:    Review of Systems  Constitutional: Negative for chills and fever.  HENT: Negative for congestion and tinnitus.   Eyes: Negative for blurred vision and double vision.  Respiratory: Positive for cough and shortness of breath. Negative for wheezing.   Cardiovascular: Positive for chest pain (Pleuritic). Negative for orthopnea and PND.  Gastrointestinal: Negative for abdominal pain, diarrhea, nausea and vomiting.  Genitourinary: Negative for dysuria and hematuria.  Neurological: Negative for dizziness, sensory change and focal weakness.  All other systems reviewed and are negative.   Nutrition: Regular Tolerating Diet: Yes Tolerating PT: Ambulatory  DRUG ALLERGIES:  No Known Allergies  VITALS:  Blood pressure 117/74, pulse 68, temperature 98.5 F (36.9 C), temperature source Oral, resp. rate (!) 24, height 5\' 7"  (1.702 m), weight 63.5 kg (140 lb), SpO2 100 %.  PHYSICAL EXAMINATION:   Physical Exam  GENERAL:  32 y.o.-year-old patient lying in bed in no acute distress.  EYES: Pupils equal, round, reactive to light and accommodation. No scleral icterus. Extraocular muscles intact.  HEENT: Head atraumatic, normocephalic. Oropharynx and nasopharynx clear.  NECK:  Supple, no jugular venous distention. No thyroid enlargement, no tenderness.  LUNGS: Normal breath sounds bilaterally, no wheezing, rales, rhonchi. No use of accessory muscles of respiration.  CARDIOVASCULAR: S1, S2 normal. No murmurs, rubs, or gallops.  ABDOMEN: Soft, nontender, nondistended. Bowel sounds present. No organomegaly or mass.   EXTREMITIES: No cyanosis, clubbing or edema b/l.    NEUROLOGIC: Cranial nerves II through XII are intact. No focal Motor or sensory deficits b/l.   PSYCHIATRIC: The patient is alert and oriented x 3.  SKIN: No obvious rash, lesion, or ulcer.    LABORATORY PANEL:   CBC Recent Labs  Lab 02/11/18 2314  WBC 14.7*  HGB 12.9*  HCT 36.1*  PLT 197   ------------------------------------------------------------------------------------------------------------------  Chemistries  Recent Labs  Lab 02/11/18 1331 02/11/18 2314  NA 137 139  K 3.6 4.0  CL 102 109  CO2 27 24  GLUCOSE 107* 119*  BUN 10 9  CREATININE 0.78 0.70  CALCIUM 9.5 8.2*  AST 19  --   ALT 13*  --   ALKPHOS 72  --   BILITOT 0.8  --    ------------------------------------------------------------------------------------------------------------------  Cardiac Enzymes Recent Labs  Lab 02/12/18 0435  TROPONINI <0.03   ------------------------------------------------------------------------------------------------------------------  RADIOLOGY:  Dg Chest 2 View  Result Date: 02/11/2018 CLINICAL DATA:  Chest pain, shortness of breath. EXAM: CHEST - 2 VIEW COMPARISON:  None. FINDINGS: The heart size and mediastinal contours are within normal limits. No pneumothorax or pleural effusion is noted. Right lung is clear. Airspace opacity is noted in lingular segment of left upper lobe concerning for pneumonia. The visualized skeletal structures are unremarkable. IMPRESSION: Lingular opacity is noted most consistent with pneumonia. Followup PA and lateral chest X-ray is recommended in 3-4 weeks following trial of antibiotic therapy to ensure resolution and exclude underlying malignancy. Electronically Signed   By: Lupita RaiderJames  Green Jr, M.D.   On: 02/11/2018 14:07     ASSESSMENT AND PLAN:   32 year old male with no significant past medical history who presents to the hospital  with shortness of breath, cough and pleuritic chest  pain and noted to have a left lingular pneumonia.  1.  Pneumonia-this is the cause of patient's shortness of breath cough and pleuritic chest pain. - Continue IV ceftriaxone, Zithromax.  Continue supportive care with pain control. -Currently afebrile, white cell count elevated but will continue to monitor.  Follow cultures.  2.  Pleurisy-secondary to pneumonia.  Will place on IV Toradol, follow.  Patient not improving by tomorrow would consider getting a CT scan of his chest.  Possible discharge home tomorrow if improved since today.   All the records are reviewed and case discussed with Care Management/Social Worker. Management plans discussed with the patient, family and they are in agreement.  CODE STATUS: Full code  DVT Prophylaxis: Lovenox  TOTAL TIME TAKING CARE OF THIS PATIENT: 30 minutes.   POSSIBLE D/C IN 1-2 DAYS, DEPENDING ON CLINICAL CONDITION.   Houston Siren M.D on 02/12/2018 at 2:23 PM  Between 7am to 6pm - Pager - [phone]  After 6pm go to [email or web address] - Social research officer, government  [facility]  Office  [phone]  CC: Primary care physician; Patient, No Pcp Per

## 2018-02-12 NOTE — Progress Notes (Signed)
Dr. Marjie SkiffSridharan notified of patient's severe, stabbing pain and medication frequency needing adjustment; also notified of fever 101.27F and normalization of Troponin; acknowledged; new order written to shorten Dilaudid frequency. Tylenol given for fever Windy Carinaurner,Flower Franko K, RN 6:25 AM 02/12/2018

## 2018-02-12 NOTE — Progress Notes (Signed)
Dr. Caryn BeeMaier notified of elevated Troponin; acknowledged; new order written for f/u Troponin in am. Windy Carinaurner,May Manrique K, RN 1:14 AM 02/12/2018

## 2018-02-12 NOTE — Progress Notes (Signed)
Initial Nutrition Assessment  DOCUMENTATION CODES:   Not applicable  INTERVENTION:   Ensure Enlive po BID, each supplement provides 350 kcal and 20 grams of protein  Pt likely at moderate refeeding risk; recommend monitor K, Mg, and P labs.   NUTRITION DIAGNOSIS:   Increased nutrient needs related to acute illness as evidenced by increased estimated needs from protein.  GOAL:   Patient will meet greater than or equal to 90% of their needs  MONITOR:   PO intake, Supplement acceptance, Labs, Weight trends, I & O's  REASON FOR ASSESSMENT:   Malnutrition Screening Tool    ASSESSMENT:   32 year old male with h/o hepatitis C presented with cough, weakness, difficulty breathing. Pt admitted for CAP   Met with pt in room today. Pt reports poor appetite and oral intake for 1 week pta. Pt reports his appetite is improved some today. Pt ate 100% of his breakfast and ate some lunch brought in by his wife. RD discussed with pt the importance of protein in preservation of lean muscle. Pt would like to have vanilla Ensure. Pt reports a 5lb(3%) wt loss over the past week. Pt reports his UBW is around 145lbs. Pt likely at moderate refeeding risk; recommend monitor K, Mg, and P labs.   Medications reviewed and include: lovenox, azithromycin, ceftriaxone, hydromorphone  Labs reviewed: wbc- 14.7(H)- 4/24   Diet Order:  Diet regular Room service appropriate? Yes; Fluid consistency: Thin  EDUCATION NEEDS:   Education needs have been addressed  Skin:  Skin Assessment: Reviewed RN Assessment  Last BM:  4/23  Height:   Ht Readings from Last 1 Encounters:  02/11/18 5' 7"  (1.702 m)    Weight:   Wt Readings from Last 1 Encounters:  02/11/18 140 lb (63.5 kg)    Ideal Body Weight:  67 kg  BMI:  Body mass index is 21.93 kg/m.  Estimated Nutritional Needs:   Kcal:  1900-2200kcal/day   Protein:  70-83g/day   Fluid:  >1.9L/day   Koleen Distance MS, RD, LDN Pager #[phone] After Hours Pager: [phone]

## 2018-02-13 LAB — CBC
HCT: 36.8 % — ABNORMAL LOW (ref 40.0–52.0)
Hemoglobin: 12.4 g/dL — ABNORMAL LOW (ref 13.0–18.0)
MCH: 32 pg (ref 26.0–34.0)
MCHC: 33.7 g/dL (ref 32.0–36.0)
MCV: 94.8 fL (ref 80.0–100.0)
PLATELETS: 205 10*3/uL (ref 150–440)
RBC: 3.88 MIL/uL — ABNORMAL LOW (ref 4.40–5.90)
RDW: 12.9 % (ref 11.5–14.5)
WBC: 9 10*3/uL (ref 3.8–10.6)

## 2018-02-13 MED ORDER — LEVOFLOXACIN 750 MG PO TABS: 750.0000 mg | ORAL_TABLET | Freq: Every day | ORAL | 0 refills | Status: AC

## 2018-02-13 NOTE — Discharge Summary (Signed)
[facility] [place] [facility]   PATIENT NAME: Arthur Burnett    MR#:  [id number]  DATE OF BIRTH:  Apr 17, 1986  DATE OF ADMISSION:  02/11/2018 ADMITTING PHYSICIAN: Ihor Austin, MD  DATE OF DISCHARGE: 02/13/2018  PRIMARY CARE PHYSICIAN: Patient, No Pcp Per    ADMISSION DIAGNOSIS:  Sepsis, due to unspecified organism (HCC) [A41.9] Community acquired pneumonia, unspecified laterality [J18.9]  DISCHARGE DIAGNOSIS:  Active Problems:   Pneumonia   SECONDARY DIAGNOSIS:  History reviewed. No pertinent past medical history.  HOSPITAL COURSE:   32 year old male with no significant past medical history who presents to the hospital with shortness of breath, cough and pleuritic chest pain and noted to have a left lingular pneumonia.  1.  Pneumonia-this was the cause of patient's shortness of breath cough and pleuritic chest pain. - Patient was treated with IV ceftriaxone and Zithromax while in the spittle, he received some Toradol for his pleuritic chest pain.  His white cell count has improved, he is afebrile.  He feels better since admission.  He is being discharged on a 5-day course of oral Levaquin.  2.  Pleurisy- this was secondary to pneumonia.  Patient received some IV Toradol and has improved.    DISCHARGE CONDITIONS:   Stable  CONSULTS OBTAINED:    DRUG ALLERGIES:  No Known Allergies  DISCHARGE MEDICATIONS:   Allergies as of 02/13/2018   No Known Allergies     Medication List    TAKE these medications   levofloxacin 750 MG tablet Commonly known as:  LEVAQUIN Take 1 tablet (750 mg total) by mouth daily for 5 days.         DISCHARGE INSTRUCTIONS:   DIET:  Regular diet  DISCHARGE CONDITION:  Stable  ACTIVITY:  Activity as tolerated  OXYGEN:  Home Oxygen: No.   Oxygen Delivery: room air  DISCHARGE LOCATION:  home   If you experience worsening of your admission symptoms, develop shortness of breath, life  threatening emergency, suicidal or homicidal thoughts you must seek medical attention immediately by calling 911 or calling your MD immediately  if symptoms less severe.  You Must read complete instructions/literature along with all the possible adverse reactions/side effects for all the Medicines you take and that have been prescribed to you. Take any new Medicines after you have completely understood and accpet all the possible adverse reactions/side effects.   Please note  You were cared for by a hospitalist during your hospital stay. If you have any questions about your discharge medications or the care you received while you were in the hospital after you are discharged, you can call the unit and asked to speak with the hospitalist on call if the hospitalist that took care of you is not available. Once you are discharged, your primary care physician will handle any further medical issues. Please note that NO REFILLS for any discharge medications will be authorized once you are discharged, as it is imperative that you return to your primary care physician (or establish a relationship with a primary care physician if you do not have one) for your aftercare needs so that they can reassess your need for medications and monitor your lab values.     Today   Pleuritic chest pain improved.  Patient denies any worsening cough, shortness of breath.  Afebrile overnight.  White cell count is normalized.  Will DC home today.  VITAL SIGNS:  Blood pressure 122/66, pulse 64, temperature (!) 97.5 F (36.4 C), resp. rate 14,  height 5\' 7"  (1.702 m), weight 63.5 kg (140 lb), SpO2 99 %.  I/O:    Intake/Output Summary (Last 24 hours) at 02/13/2018 1333 Last data filed at 02/13/2018 1100 Gross per 24 hour  Intake 2851 ml  Output 2200 ml  Net 651 ml    PHYSICAL EXAMINATION:   GENERAL:  32 y.o.-year-old patient lying in bed in no acute distress.  EYES: Pupils equal, round, reactive to light and  accommodation. No scleral icterus. Extraocular muscles intact.  HEENT: Head atraumatic, normocephalic. Oropharynx and nasopharynx clear.  NECK:  Supple, no jugular venous distention. No thyroid enlargement, no tenderness.  LUNGS: Normal breath sounds bilaterally, no wheezing, rales, rhonchi. No use of accessory muscles of respiration.  CARDIOVASCULAR: S1, S2 normal. No murmurs, rubs, or gallops.  ABDOMEN: Soft, nontender, nondistended. Bowel sounds present. No organomegaly or mass.  EXTREMITIES: No cyanosis, clubbing or edema b/l.    NEUROLOGIC: Cranial nerves II through XII are intact. No focal Motor or sensory deficits b/l.   PSYCHIATRIC: The patient is alert and oriented x 3.  SKIN: No obvious rash, lesion, or ulcer.    DATA REVIEW:   CBC Recent Labs  Lab 02/13/18 0428  WBC 9.0  HGB 12.4*  HCT 36.8*  PLT 205    Chemistries  Recent Labs  Lab 02/11/18 1331 02/11/18 2314  NA 137 139  K 3.6 4.0  CL 102 109  CO2 27 24  GLUCOSE 107* 119*  BUN 10 9  CREATININE 0.78 0.70  CALCIUM 9.5 8.2*  AST 19  --   ALT 13*  --   ALKPHOS 72  --   BILITOT 0.8  --     Cardiac Enzymes Recent Labs  Lab 02/12/18 0435  TROPONINI <0.03    Microbiology Results  Results for orders placed or performed during the hospital encounter of 02/11/18  Blood Culture (routine x 2)     Status: None (Preliminary result)   Collection Time: 02/11/18  3:07 PM  Result Value Ref Range Status   Specimen Description BLOOD LEFT AC  Final   Special Requests   Final    BOTTLES DRAWN AEROBIC AND ANAEROBIC Blood Culture results may not be optimal due to an excessive volume of blood received in culture bottles   Culture   Final    NO GROWTH 2 DAYS Performed at [facility], [address] [place], [place] [address]    Report Status PENDING  Incomplete  Blood Culture (routine x 2)     Status: None (Preliminary result)   Collection Time: 02/11/18  3:07 PM  Result Value Ref Range Status    Specimen Description BLOOD UPPER ARM  Final   Special Requests   Final    BOTTLES DRAWN AEROBIC AND ANAEROBIC Blood Culture adequate volume   Culture   Final    NO GROWTH 2 DAYS Performed at [facility], [address], [place], [place] [address]    Report Status PENDING  Incomplete  MRSA PCR Screening     Status: None   Collection Time: 02/11/18  7:05 PM  Result Value Ref Range Status   MRSA by PCR NEGATIVE NEGATIVE Final    Comment:        The GeneXpert MRSA Assay (FDA approved for NASAL specimens only), is one component of a comprehensive MRSA colonization surveillance program. It is not intended to diagnose MRSA infection nor to guide or monitor treatment for MRSA infections. Performed at [facility], [address] [place],  [place] [id number]     RADIOLOGY:  Dg Chest 2 View  Result Date: 02/11/2018 CLINICAL DATA:  Chest pain, shortness of breath. EXAM: CHEST - 2 VIEW COMPARISON:  None. FINDINGS: The heart size and mediastinal contours are within normal limits. No pneumothorax or pleural effusion is noted. Right lung is clear. Airspace opacity is noted in lingular segment of left upper lobe concerning for pneumonia. The visualized skeletal structures are unremarkable. IMPRESSION: Lingular opacity is noted most consistent with pneumonia. Followup PA and lateral chest X-ray is recommended in 3-4 weeks following trial of antibiotic therapy to ensure resolution and exclude underlying malignancy. Electronically Signed   By: Lupita RaiderJames  Green Jr, M.D.   On: 02/11/2018 14:07      Management plans discussed with the patient, family and they are in agreement.  CODE STATUS:     Code Status Orders  (From admission, onward)        Start     Ordered   02/11/18 1644  Full code  Continuous     02/11/18 1643    Code Status History    This patient has a current code status but no historical code status.      TOTAL TIME TAKING CARE OF THIS PATIENT: 40 minutes.     Houston SirenSAINANI,Levetta Bognar J M.D on 02/13/2018 at 1:33 PM  Between 7am to 6pm - Pager - [phone]  After 6pm go to [email or web address] - Social research officer, governmentpassword [email or web address]  [facility]  Office  [phone]  CC: Primary care physician; Patient, No Pcp Per

## 2018-02-13 NOTE — Progress Notes (Signed)
Discharge instructions reviewed with patient including new medications.  Understanding was verbalized and all questions were answered.  Patient discharged ambulatory escorted by nursing staff.

## 2018-02-13 NOTE — Progress Notes (Signed)
[facility]    Arthur Burnett was admitted to the Hospital on 02/11/2018 and Discharged  02/13/2018 and should be excused from work/school   for 5 days starting 02/11/2018 , may return to work/school without any restrictions.  Call Arthur LiasVivek Arhaan Chesnut MD, [facility]  [phone] with questions.  Arthur SirenSAINANI,Arthur Burnett J M.D on 02/13/2018,at 1:13 PM

## 2018-02-13 NOTE — Progress Notes (Signed)
Pharmacy Antibiotic Note  Bennett ScrapeChristopher Jeffrey Burnsed is a 32 y.o. male admitted on 02/11/2018 with pneumonia.  Pharmacy has been consulted for ceftriaxone dosing.  Plan: ceftriaxone 1gm iv q24h   Height: 5\' 7"  (170.2 cm) Weight: 140 lb (63.5 kg) IBW/kg (Calculated) : 66.1  Temp (24hrs), Avg:98.6 F (37 C), Min:98.5 F (36.9 C), Max:98.9 F (37.2 C)  Recent Labs  Lab 02/11/18 1331 02/11/18 1507 02/11/18 2314 02/13/18 0428  WBC 11.4*  --  14.7* 9.0  CREATININE 0.78  --  0.70  --   LATICACIDVEN  --  1.2  --   --     Estimated Creatinine Clearance: 119.1 mL/min (by C-G formula based on SCr of 0.7 mg/dL).    No Known Allergies  Antimicrobials this admission: CTX 4/24 >> Azithro 4/24 >>  BCx x2 NGTD MRSA PCR neg  Thank you for allowing pharmacy to be a part of this patient's care.  Marty HeckWang, Quanta Robertshaw L 02/13/2018 10:49 AM

## 2018-02-18 LAB — CULTURE, BLOOD (ROUTINE X 2)
CULTURE: NO GROWTH
Culture: NO GROWTH
Special Requests: ADEQUATE

## 2019-07-12 IMAGING — CR DG CHEST 2V
1 series · 2 of 2 positions shown · non-contrast
Comparison: None.

CLINICAL DATA: Chest pain, shortness of breath.

EXAM:
CHEST - 2 VIEW

[Series 1: w chest pa · 0.14mm/px · 2 of 2 slices shown]
[im 1/2]
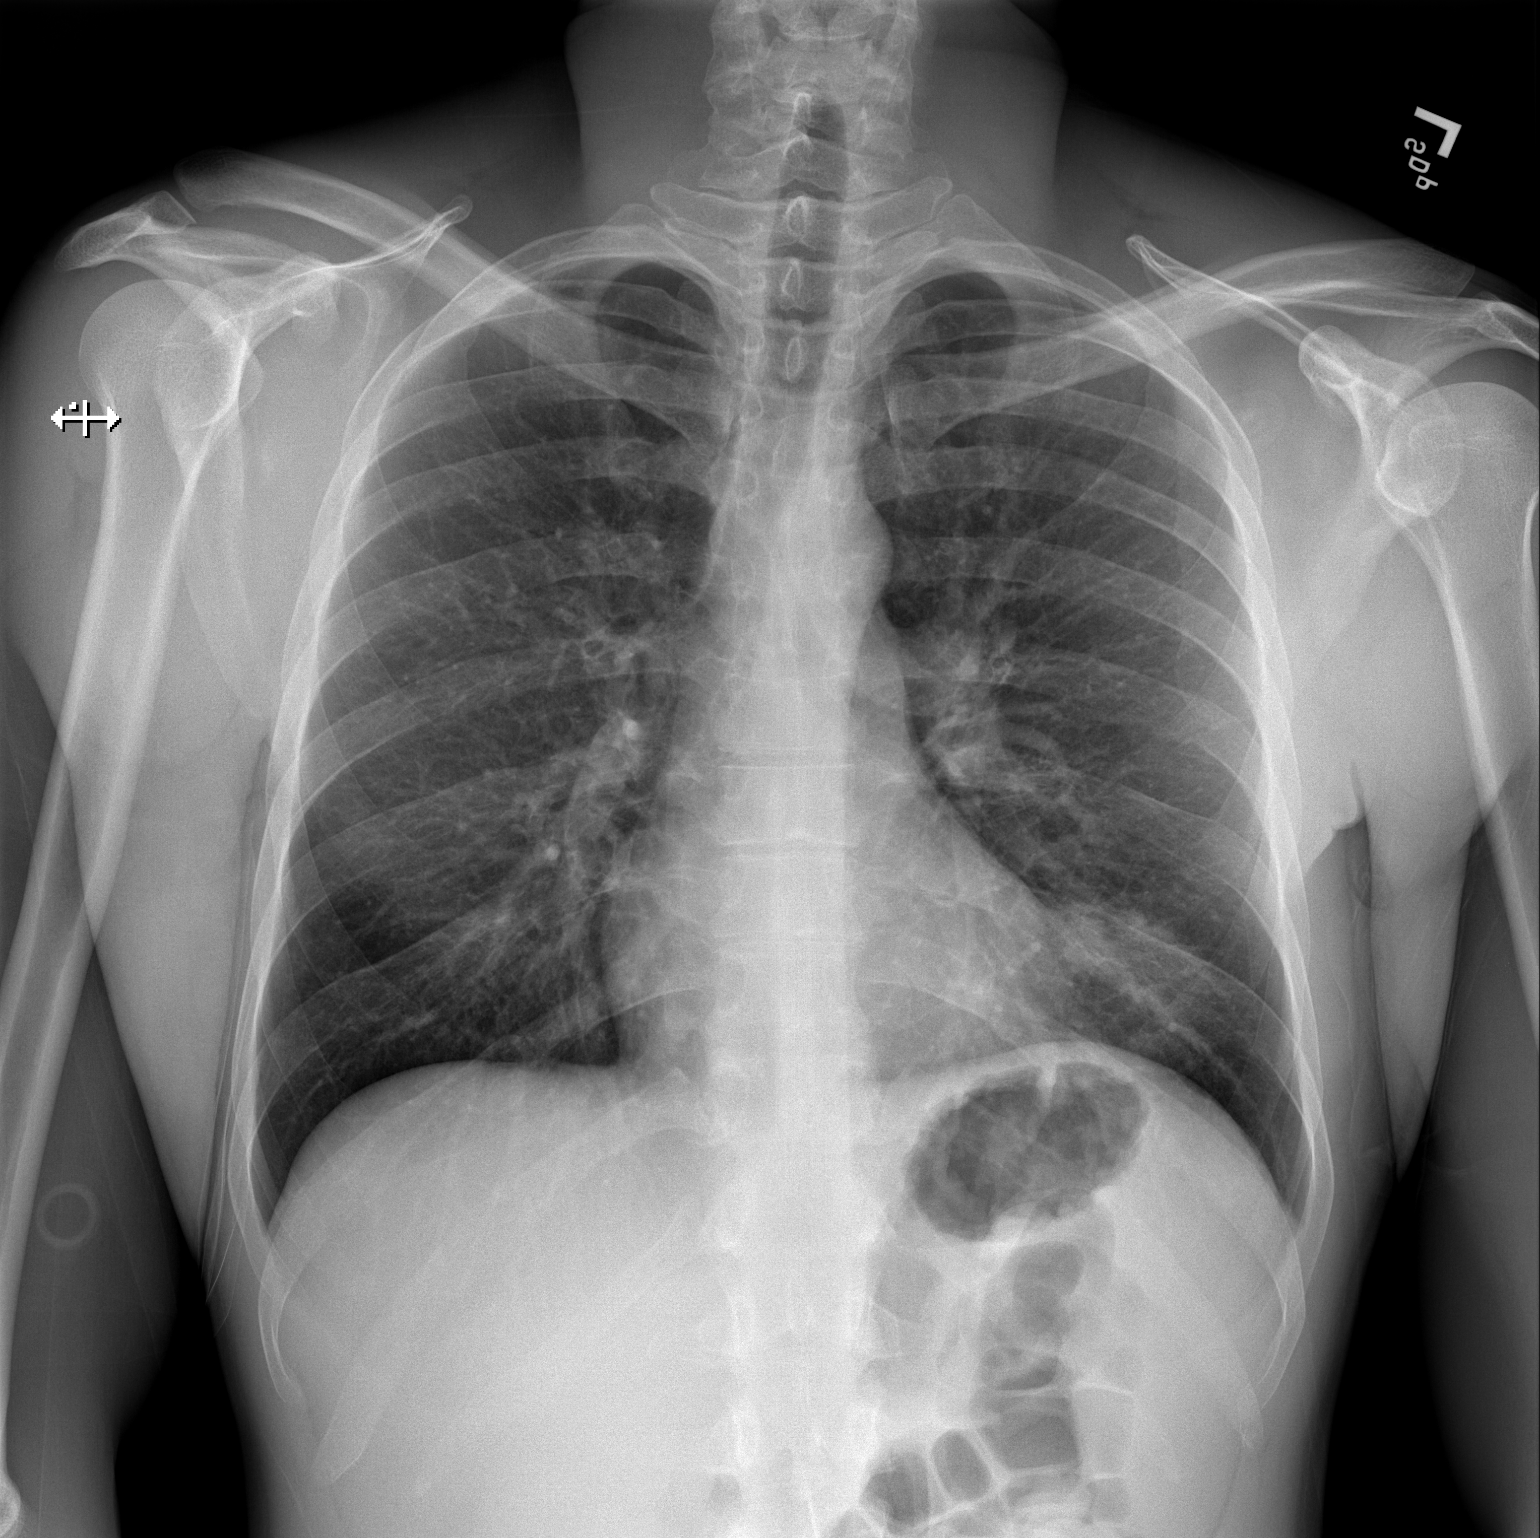
[im 2/2]
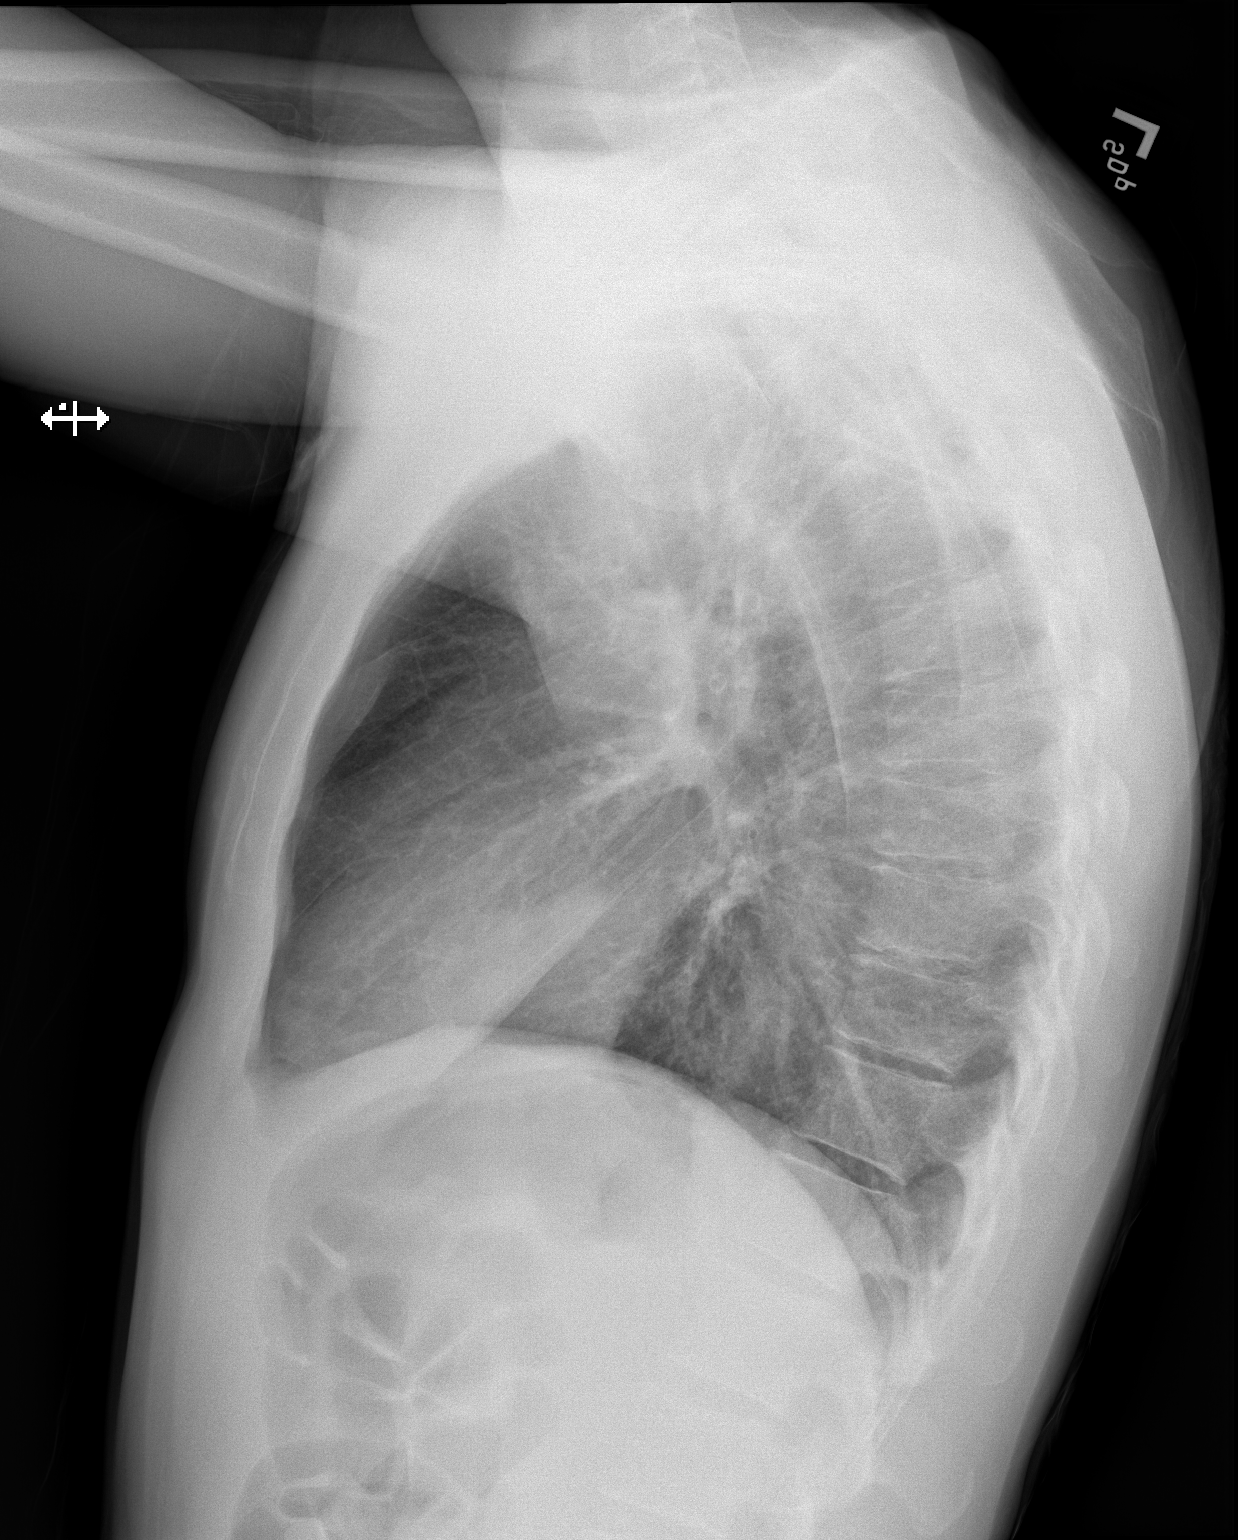

[2 of 2 positions shown; findings below may reference images not displayed]

FINDINGS: The heart size and mediastinal contours are within normal limits. No
pneumothorax or pleural effusion is noted. Right lung is clear.
Airspace opacity is noted in lingular segment of left upper lobe
concerning for pneumonia. The visualized skeletal structures are
unremarkable.
IMPRESSION: Lingular opacity is noted most consistent with pneumonia. Followup
PA and lateral chest X-ray is recommended in 3-4 weeks following
trial of antibiotic therapy to ensure resolution and exclude
underlying malignancy.
